# Patient Record
Sex: Female | Born: 2000 | Race: Black or African American | Hispanic: No | Marital: Single | Smoking: Never smoker
Health system: Southern US, Community
[De-identification: ages and names within clinical notes are randomized; demographics above are authoritative.]

## PROBLEM LIST (undated history)

## (undated) DIAGNOSIS — I10 Essential (primary) hypertension: Secondary | ICD-10-CM

## (undated) DIAGNOSIS — F32A Depression, unspecified: Secondary | ICD-10-CM

## (undated) DIAGNOSIS — F419 Anxiety disorder, unspecified: Secondary | ICD-10-CM

## (undated) DIAGNOSIS — F329 Major depressive disorder, single episode, unspecified: Secondary | ICD-10-CM

## (undated) DIAGNOSIS — F909 Attention-deficit hyperactivity disorder, unspecified type: Secondary | ICD-10-CM

---
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
## 2020-01-22 ENCOUNTER — Ambulatory Visit: Payer: Self-pay

## 2020-02-13 NOTE — Progress Notes (Deleted)
GYNECOLOGY ANNUAL PREVENTATIVE CARE ENCOUNTER NOTE  History:     Beverly Olson is a 20 y.o. No obstetric history on file. female here for a routine annual gynecologic exam.  Current complaints: ***.   Denies abnormal vaginal bleeding, discharge, pelvic pain, problems with intercourse or other gynecologic concerns.    Gynecologic History No LMP recorded. Contraception: *** Last Pap: None  Sexually acitve *** partners*** pain Onset of menses, flow***, pain  Obstetric History OB History  No obstetric history on file.    Past Medical History:  Diagnosis Date  . ADHD (attention deficit hyperactivity disorder)   . Anxiety   . Depression   . High blood pressure    she reports that she takes Clonidine for management    No past surgical history on file.  Current Outpatient Medications on File Prior to Visit  Medication Sig Dispense Refill  . cloNIDine (CATAPRES) 0.1 MG tablet Take 1.5 tablets (0.15 mg total) by mouth at bedtime. (Patient not taking: Reported on 08/02/2018) 45 tablet 0  . dexmethylphenidate (FOCALIN XR) 10 MG 24 hr capsule Take 1 capsule (10 mg total) by mouth daily. (Patient not taking: Reported on 08/02/2018) 30 capsule 0  . sertraline (ZOLOFT) 25 MG tablet Take 3 tablets (75 mg total) by mouth daily. (Patient not taking: Reported on 08/02/2018) 90 tablet 0   No current facility-administered medications on file prior to visit.    No Known Allergies  Social History:  reports that she has never smoked. She has never used smokeless tobacco. She reports that she does not drink alcohol and does not use drugs.  Family History  Problem Relation Age of Onset  . Hypertension Mother   . Bipolar disorder Mother     The following portions of the patient's history were reviewed and updated as appropriate: allergies, current medications, past family history, past medical history, past social history, past surgical history and problem list.  Review of  Systems Pertinent items noted in HPI and remainder of comprehensive ROS otherwise negative.  Physical Exam:  There were no vitals taken for this visit. CONSTITUTIONAL: Well-developed, well-nourished female in no acute distress.  HENT:  Normocephalic, atraumatic, External right and left ear normal. Oropharynx is clear and moist EYES: Conjunctivae and EOM are normal. Pupils are equal, round, and reactive to light. No scleral icterus.  NECK: Normal range of motion, supple, no masses.  Normal thyroid.  SKIN: Skin is warm and dry. No rash noted. Not diaphoretic. No erythema. No pallor. MUSCULOSKELETAL: Normal range of motion. No tenderness.  No cyanosis, clubbing, or edema.  2+ distal pulses. NEUROLOGIC: Alert and oriented to person, place, and time. Normal reflexes, muscle tone coordination.  PSYCHIATRIC: Normal mood and affect. Normal behavior. Normal judgment and thought content. CARDIOVASCULAR: Normal heart rate noted, regular rhythm RESPIRATORY: Clear to auscultation bilaterally. Effort and breath sounds normal, no problems with respiration noted. BREASTS: Symmetric in size. No masses, tenderness, skin changes, nipple drainage, or lymphadenopathy bilaterally. Performed in the presence of a chaperone. ABDOMEN: Soft, no distention noted.  No tenderness, rebound or guarding.  PELVIC: Normal appearing external genitalia and urethral meatus; normal appearing vaginal mucosa and cervix.  No abnormal discharge noted.  Pap smear obtained.  Normal uterine size, no other palpable masses, no uterine or adnexal tenderness.  Performed in the presence of a chaperone.   Assessment and Plan:    1. Women's annual routine gynecological examination ***  Will follow up results of pap smear and manage accordingly. Mammogram scheduled  Routine preventative health maintenance measures emphasized. Please refer to After Visit Summary for other counseling recommendations.      [id number] RN, FNP, [facility]

## 2020-02-14 ENCOUNTER — Ambulatory Visit: Payer: Self-pay | Admitting: Nurse Practitioner

## 2020-02-14 DIAGNOSIS — Z01419 Encounter for gynecological examination (general) (routine) without abnormal findings: Secondary | ICD-10-CM

## 2020-02-17 ENCOUNTER — Encounter: Payer: Self-pay | Admitting: Nurse Practitioner

## 2020-03-17 ENCOUNTER — Telehealth: Payer: Self-pay | Admitting: Nurse Practitioner

## 2020-03-17 NOTE — Telephone Encounter (Signed)
Patient returned call.  I apologized for this error.  I assured patient I would address issue.  I have placed IT request to have erroneous messages removed.

## 2020-03-17 NOTE — Addendum Note (Signed)
Addended by: Pennie Banter on: 03/17/2020 10:27 AM   Modules accepted: Level of Service, SmartSet

## 2020-03-17 NOTE — Telephone Encounter (Signed)
This encounter was created in error - please disregard.

## 2020-03-17 NOTE — Telephone Encounter (Signed)
Telephone call to patient for follow-up on her concerns with a provider.

## 2020-03-17 NOTE — Addendum Note (Signed)
Addended by: Pennie Banter on: 03/17/2020 10:24 AM   Modules accepted: Level of Service, SmartSet

## 2020-08-28 ENCOUNTER — Ambulatory Visit: Admit: 2020-08-28 | Discharge status: Absent without leave | Payer: Self-pay

## 2020-08-31 ENCOUNTER — Emergency Department: Payer: PRIVATE HEALTH INSURANCE

## 2020-08-31 ENCOUNTER — Other Ambulatory Visit: Payer: Self-pay

## 2020-08-31 ENCOUNTER — Emergency Department
Admission: EM | Admit: 2020-08-31 | Discharge: 2020-08-31 | Disposition: A | Discharge status: Home / Self Care | Payer: PRIVATE HEALTH INSURANCE | Attending: Student in an Organized Health Care Education/Training Program | Admitting: Student in an Organized Health Care Education/Training Program

## 2020-08-31 DIAGNOSIS — N12 Tubulo-interstitial nephritis, not specified as acute or chronic: Secondary | ICD-10-CM

## 2020-08-31 DIAGNOSIS — R1011 Right upper quadrant pain: Secondary | ICD-10-CM

## 2020-08-31 DIAGNOSIS — Z20822 Contact with and (suspected) exposure to covid-19: Secondary | ICD-10-CM | POA: Insufficient documentation

## 2020-08-31 LAB — PROTIME-INR
INR: 1.2 (ref 0.8–1.2)
Prothrombin Time: 15.4 seconds — ABNORMAL HIGH (ref 11.4–15.2)

## 2020-08-31 LAB — CBC
HCT: 38.1 % (ref 36.0–46.0)
Hemoglobin: 13.2 g/dL (ref 12.0–15.0)
MCH: 31.1 pg (ref 26.0–34.0)
MCHC: 34.6 g/dL (ref 30.0–36.0)
MCV: 89.9 fL (ref 80.0–100.0)
Platelets: 249 10*3/uL (ref 150–400)
RBC: 4.24 MIL/uL (ref 3.87–5.11)
RDW: 12.7 % (ref 11.5–15.5)
WBC: 19.9 10*3/uL — ABNORMAL HIGH (ref 4.0–10.5)
nRBC: 0 % (ref 0.0–0.2)

## 2020-08-31 LAB — URINALYSIS, COMPLETE (UACMP) WITH MICROSCOPIC
Bilirubin Urine: NEGATIVE
Glucose, UA: NEGATIVE mg/dL
Ketones, ur: 5 mg/dL — AB
Nitrite: NEGATIVE
Protein, ur: 100 mg/dL — AB
Specific Gravity, Urine: 1.039 — ABNORMAL HIGH (ref 1.005–1.030)
WBC, UA: >50 WBC/hpf — ABNORMAL HIGH (ref 0–5)
pH: 6 (ref 5.0–8.0)

## 2020-08-31 LAB — MAGNESIUM: Magnesium: 1.8 mg/dL (ref 1.7–2.4)

## 2020-08-31 LAB — RESP PANEL BY RT-PCR (FLU A&B, COVID) ARPGX2
Influenza A by PCR: NEGATIVE
Influenza B by PCR: NEGATIVE
SARS Coronavirus 2 by RT PCR: NEGATIVE

## 2020-08-31 LAB — COMPREHENSIVE METABOLIC PANEL
ALT: 127 U/L — ABNORMAL HIGH (ref 0–44)
AST: 87 U/L — ABNORMAL HIGH (ref 15–41)
Albumin: 3.8 g/dL (ref 3.5–5.0)
Alkaline Phosphatase: 29 U/L — ABNORMAL LOW (ref 38–126)
Anion gap: 12 (ref 5–15)
BUN: 8 mg/dL (ref 6–20)
CO2: 26 mmol/L (ref 22–32)
Calcium: 9 mg/dL (ref 8.9–10.3)
Chloride: 96 mmol/L — ABNORMAL LOW (ref 98–111)
Creatinine, Ser: 0.97 mg/dL (ref 0.44–1.00)
GFR, Estimated: >60 mL/min (ref >60)
Glucose, Bld: 123 mg/dL — ABNORMAL HIGH (ref 70–99)
Potassium: 2.7 mmol/L — CL (ref 3.5–5.1)
Sodium: 134 mmol/L — ABNORMAL LOW (ref 135–145)
Total Bilirubin: 0.9 mg/dL (ref 0.3–1.2)
Total Protein: 8.2 g/dL — ABNORMAL HIGH (ref 6.5–8.1)

## 2020-08-31 LAB — APTT: aPTT: 40 seconds — ABNORMAL HIGH (ref 24–36)

## 2020-08-31 LAB — HCG, QUANTITATIVE, PREGNANCY: hCG, Beta Chain, Quant, S: 1 m[IU]/mL (ref <5)

## 2020-08-31 LAB — LACTIC ACID, PLASMA: Lactic Acid, Venous: 1.1 mmol/L (ref 0.5–1.9)

## 2020-08-31 LAB — LIPASE, BLOOD: Lipase: 25 U/L (ref 11–51)

## 2020-08-31 MED ORDER — ONDANSETRON HCL 4 MG/2ML IJ SOLN
4.0000 mg | Freq: Once | INTRAMUSCULAR | Status: AC
  Administered 2020-08-31: 4 mg via INTRAVENOUS
  Filled 2020-08-31: qty 2

## 2020-08-31 MED ORDER — LACTATED RINGERS IV BOLUS
1000.0000 mL | Freq: Once | INTRAVENOUS | Status: AC
  Administered 2020-08-31: 1000 mL via INTRAVENOUS

## 2020-08-31 MED ORDER — SULFAMETHOXAZOLE-TRIMETHOPRIM 800-160 MG PO TABS: 1.0000 | ORAL_TABLET | Freq: Two times a day (BID) | ORAL | 0 refills | Status: AC

## 2020-08-31 MED ORDER — KETOROLAC TROMETHAMINE 30 MG/ML IJ SOLN
15.0000 mg | Freq: Once | INTRAMUSCULAR | Status: AC
  Administered 2020-08-31: 15 mg via INTRAVENOUS
  Filled 2020-08-31: qty 1

## 2020-08-31 MED ORDER — POTASSIUM CHLORIDE 10 MEQ/100ML IV SOLN
10.0000 meq | Freq: Once | INTRAVENOUS | Status: AC
  Administered 2020-08-31: 10 meq via INTRAVENOUS
  Filled 2020-08-31: qty 100

## 2020-08-31 MED ORDER — IBUPROFEN 600 MG PO TABS
600.0000 mg | ORAL_TABLET | Freq: Once | ORAL | Status: DC
  Filled 2020-08-31: qty 1

## 2020-08-31 MED ORDER — MORPHINE SULFATE (PF) 4 MG/ML IV SOLN
4.0000 mg | INTRAVENOUS | Status: DC | PRN
  Administered 2020-08-31: 4 mg via INTRAVENOUS
  Filled 2020-08-31: qty 1

## 2020-08-31 MED ORDER — IOHEXOL 350 MG/ML SOLN
80.0000 mL | Freq: Once | INTRAVENOUS | Status: AC | PRN
  Administered 2020-08-31: 80 mL via INTRAVENOUS

## 2020-08-31 MED ORDER — SODIUM CHLORIDE 0.9 % IV BOLUS
1000.0000 mL | Freq: Once | INTRAVENOUS | Status: AC
  Administered 2020-08-31: 1000 mL via INTRAVENOUS

## 2020-08-31 MED ORDER — ACETAMINOPHEN 325 MG PO TABS
650.0000 mg | ORAL_TABLET | Freq: Once | ORAL | Status: AC
  Administered 2020-08-31: 650 mg via ORAL
  Filled 2020-08-31: qty 2

## 2020-08-31 MED ORDER — SODIUM CHLORIDE 0.9 % IV SOLN
1.0000 g | Freq: Once | INTRAVENOUS | Status: AC
  Administered 2020-08-31: 1 g via INTRAVENOUS
  Filled 2020-08-31: qty 10

## 2020-08-31 MED ORDER — ONDANSETRON HCL 4 MG PO TABS: 4.0000 mg | ORAL_TABLET | Freq: Three times a day (TID) | ORAL | 0 refills | Status: AC | PRN

## 2020-08-31 NOTE — ED Triage Notes (Signed)
Pt comes via [facility] with RUQ pain bilateral. Pt states this started few days ago. Pt states N/V  HR-94 112/79 RR-16 100%RA CBG-133

## 2020-08-31 NOTE — ED Provider Notes (Signed)
[facility] Emergency Department Provider Note    Event Date/Time   First MD Initiated Contact with Patient 08/31/20 1311     (approximate)  I have reviewed the triage vital signs and the nursing notes.   HISTORY  Chief Complaint Abdominal Pain    HPI Beverly Olson is a 20 y.o. female below listed past medical history presents to the ER for evaluation of generalized abdominal pain this been ongoing for the past several days associated nausea vomiting poor p.o. intake.  Describes it as crampy in nature radiating through to her back.  States primarily in the right upper quadrant but hurts all over.  Is never had pain like this before.  Has been taking Motrin with only mild improvement.  Denies any vaginal bleeding or discharge.  No dysuria.  Past Medical History:  Diagnosis Date   ADHD (attention deficit hyperactivity disorder)    Anxiety    Depression    High blood pressure    she reports that she takes Clonidine for management   Family History  Problem Relation Age of Onset   Hypertension Mother    Bipolar disorder Mother    History reviewed. No pertinent surgical history. Patient Active Problem List   Diagnosis Date Noted   ADHD (attention deficit hyperactivity disorder), combined type 03/18/2013   ODD (oppositional defiant disorder) 03/18/2013   MDD (major depressive disorder), recurrent episode, severe (HCC) 03/17/2013      Prior to Admission medications   Medication Sig Start Date End Date Taking? Authorizing Provider  cloNIDine (CATAPRES) 0.1 MG tablet Take 1.5 tablets (0.15 mg total) by mouth at bedtime. Patient not taking: Reported on 08/02/2018 03/25/13   Jolene Schimke, NP  dexmethylphenidate (FOCALIN XR) 10 MG 24 hr capsule Take 1 capsule (10 mg total) by mouth daily. Patient not taking: Reported on 08/02/2018 03/25/13   Trinda Pascal B, NP  sertraline (ZOLOFT) 25 MG tablet Take 3 tablets (75 mg total) by mouth daily. Patient not taking:  Reported on 08/02/2018 03/25/13   Jolene Schimke, NP    Allergies Patient has no known allergies.    Social History Social History   Tobacco Use   Smoking status: Never   Smokeless tobacco: Never  Substance Use Topics   Alcohol use: No   Drug use: No    Review of Systems Patient denies headaches, rhinorrhea, blurry vision, numbness, shortness of breath, chest pain, edema, cough, abdominal pain, nausea, vomiting, diarrhea, dysuria, fevers, rashes or hallucinations unless otherwise stated above in HPI. ____________________________________________   PHYSICAL EXAM:  VITAL SIGNS: Vitals:   08/31/20 1205 08/31/20 1500  BP: 98/85 121/72  Pulse: (!) 104 91  Resp: 18   Temp: (!) 102.1 F (38.9 C)   SpO2: 100% 100%    Constitutional: Alert and oriented.  Eyes: Conjunctivae are normal.  Head: Atraumatic. Nose: No congestion/rhinnorhea. Mouth/Throat: Mucous membranes are moist.   Neck: No stridor. Painless ROM.  Cardiovascular: Normal rate, regular rhythm. Grossly normal heart sounds.  Good peripheral circulation. Respiratory: Normal respiratory effort.  No retractions. Lungs CTAB. Gastrointestinal: Soft with mild ruq ttp. No distention. No abdominal bruits. No CVA tenderness. Genitourinary:  Musculoskeletal: No lower extremity tenderness nor edema.  No joint effusions. Neurologic:  Normal speech and language. No gross focal neurologic deficits are appreciated. No facial droop Skin:  Skin is warm, dry and intact. No rash noted. Psychiatric: Mood and affect are normal. Speech and behavior are normal.  ____________________________________________   LABS (all labs ordered  are listed, but only abnormal results are displayed)  Results for orders placed or performed during the hospital encounter of 08/31/20 (from the past 24 hour(s))  Lipase, blood     Status: None   Collection Time: 08/31/20 11:48 AM  Result Value Ref Range   Lipase 25 11 - 51 U/L  Comprehensive metabolic  panel     Status: Abnormal   Collection Time: 08/31/20 11:48 AM  Result Value Ref Range   Sodium 134 (L) 135 - 145 mmol/L   Potassium 2.7 (LL) 3.5 - 5.1 mmol/L   Chloride 96 (L) 98 - 111 mmol/L   CO2 26 22 - 32 mmol/L   Glucose, Bld 123 (H) 70 - 99 mg/dL   BUN 8 6 - 20 mg/dL   Creatinine, Ser 2.50 0.44 - 1.00 mg/dL   Calcium 9.0 8.9 - 03.7 mg/dL   Total Protein 8.2 (H) 6.5 - 8.1 g/dL   Albumin 3.8 3.5 - 5.0 g/dL   AST 87 (H) 15 - 41 U/L   ALT 127 (H) 0 - 44 U/L   Alkaline Phosphatase 29 (L) 38 - 126 U/L   Total Bilirubin 0.9 0.3 - 1.2 mg/dL   GFR, Estimated >04 >88 mL/min   Anion gap 12 5 - 15  CBC     Status: Abnormal   Collection Time: 08/31/20 11:48 AM  Result Value Ref Range   WBC 19.9 (H) 4.0 - 10.5 K/uL   RBC 4.24 3.87 - 5.11 MIL/uL   Hemoglobin 13.2 12.0 - 15.0 g/dL   HCT 89.1 69.4 - 50.3 %   MCV 89.9 80.0 - 100.0 fL   MCH 31.1 26.0 - 34.0 pg   MCHC 34.6 30.0 - 36.0 g/dL   RDW 88.8 28.0 - 03.4 %   Platelets 249 150 - 400 K/uL   nRBC 0.0 0.0 - 0.2 %  hCG, quantitative, pregnancy     Status: None   Collection Time: 08/31/20 11:48 AM  Result Value Ref Range   hCG, Beta Chain, Quant, S 1 <5 mIU/mL   ____________________________________________ ____________________________________________  RADIOLOGY  I personally reviewed all radiographic images ordered to evaluate for the above acute complaints and reviewed radiology reports and findings.  These findings were personally discussed with the patient.  Please see medical record for radiology report.  ____________________________________________   PROCEDURES  Procedure(s) performed:  Procedures    Critical Care performed: no ____________________________________________   INITIAL IMPRESSION / ASSESSMENT AND PLAN / ED COURSE  Pertinent labs & imaging results that were available during my care of the patient were reviewed by me and considered in my medical decision making (see chart for details).   DDX:  cholecystitis, cholelithiasis, enteritis, appendicitis, pyelo, stone, pid  Beverly Olson is a 20 y.o. who presents to the ED with abdominal pain described above.  She is febrile but nontoxic-appearing does have right-sided abdominal pain.  Will order right upper quadrant ultrasound.  Right upper quadrant ultrasound is equivocal does have new leukocytosis.  Not able to provide urine will give additional IV fluids.  Will order CT imaging.  Work-up signed out to oncoming physician.  Lactate still pending as well as COVID.     The patient was evaluated in Emergency Department today for the symptoms described in the history of present illness. He/she was evaluated in the context of the global COVID-19 pandemic, which necessitated consideration that the patient might be at risk for infection with the SARS-CoV-2 virus that causes COVID-19. Institutional protocols and algorithms that pertain  to the evaluation of patients at risk for COVID-19 are in a state of rapid change based on information released by regulatory bodies including the CDC and federal and state organizations. These policies and algorithms were followed during the patient's care in the ED.  As part of my medical decision making, I reviewed the following data within the electronic MEDICAL RECORD NUMBER Nursing notes reviewed and incorporated, Labs reviewed, notes from prior ED visits and [place] Controlled Substance Database  ____________________________________________   FINAL CLINICAL IMPRESSION(S) / ED DIAGNOSES  Final diagnoses:  RUQ abdominal pain      NEW MEDICATIONS STARTED DURING THIS VISIT:  New Prescriptions   No medications on file     Note:  This document was prepared using Dragon voice recognition software and may include unintentional dictation errors.    Willy Eddy, MD 08/31/20 1515

## 2020-08-31 NOTE — ED Provider Notes (Signed)
I assumed care of this patient approximately 1500.  Please see all providers note for full details gunpoint initial evaluation assessment.  In brief she presents for 2 3 days of some right upper quadrant abdominal pain associate with nonbloody nonbilious vomiting.  On arrival she is febrile and slight tachycardic.  Initial work-up was remarkable for leukocytosis.  CMP shows very mild transaminitis but otherwise largely unremarkable aside from hypokalemia.  Right upper quadrant ultrasound is negative.  Lactic sent as well as blood cultures with signs of possible sepsis. Plan is to follow-up CT abdomen pelvis.  As needed pelvis markable for evidence of bilateral pyelonephritis without evidence of kidney stone, appendicitis, cholecystitis, pancreatitis, diverticulitis or other acute process aside from possible mild enterocolitis.  UA is concerning for infection with large leukocyte esterase, greater than 50 WBCs, bacteria present.  Patient given dose of Rocephin in the emergency room as well as IV fluids and analgesia.  On reassessment she is feeling little better.  She did not do any SIRS criteria doubtful if she has bacteremic or otherwise septic at this time and I think she is stable for discharge with close outpatient follow-up.  Nausea is better controlled and she is tolerating p.o.  Will discharge with Bactrim and Zofran.  Advised to follow-up with PCP in 2 to 3 days have her potassium rechecked.  Discharged stable condition.  Strict return precautions advised and discussed.   Gilles Chiquito, MD 08/31/20 [phone]

## 2020-08-31 NOTE — ED Notes (Signed)
Pt lying in bed watching TV, awaiting results from tests. Pt states that she feels better than when she first came, but is still having some pain. Pt vitals stable and in no obvious distress at this time.

## 2020-09-01 ENCOUNTER — Telehealth: Payer: Self-pay | Admitting: Emergency Medicine

## 2020-09-01 LAB — BLOOD CULTURE ID PANEL (REFLEXED) - BCID2

## 2020-09-01 LAB — POC URINE PREG, ED: Preg Test, Ur: NEGATIVE

## 2020-09-03 LAB — CULTURE, BLOOD (SINGLE): Special Requests: ADEQUATE

## 2020-09-03 LAB — URINE CULTURE: Culture: >=100000 — AB

## 2021-05-20 ENCOUNTER — Encounter: Payer: PRIVATE HEALTH INSURANCE | Admitting: Obstetrics & Gynecology

## 2021-06-04 ENCOUNTER — Other Ambulatory Visit: Payer: Self-pay

## 2021-06-04 MED ORDER — SERTRALINE HCL 100 MG PO TABS
100.0000 mg | ORAL_TABLET | Freq: Every day | ORAL | 1 refills | Status: AC
  Filled 2021-06-04 – 2021-10-01 (×3): qty 90, 90d supply, fill #0

## 2021-06-22 ENCOUNTER — Other Ambulatory Visit: Payer: Self-pay

## 2021-09-07 ENCOUNTER — Other Ambulatory Visit: Payer: Self-pay

## 2021-09-21 ENCOUNTER — Other Ambulatory Visit: Payer: Self-pay

## 2021-10-01 ENCOUNTER — Other Ambulatory Visit: Payer: Self-pay

## 2022-01-25 ENCOUNTER — Other Ambulatory Visit: Payer: Self-pay

## 2022-01-26 ENCOUNTER — Other Ambulatory Visit: Payer: Self-pay

## 2022-01-26 MED ORDER — SERTRALINE HCL 100 MG PO TABS
100.0000 mg | ORAL_TABLET | Freq: Every day | ORAL | 0 refills | Status: AC
  Filled 2022-02-11: qty 30, 30d supply, fill #0

## 2022-02-11 ENCOUNTER — Other Ambulatory Visit: Payer: Self-pay

## 2022-09-09 IMAGING — US US ABDOMEN LIMITED
1 series · 14 of 25 positions shown · non-contrast
Comparison: None.

CLINICAL DATA: Right upper quadrant pain

EXAM:
ULTRASOUND ABDOMEN LIMITED RIGHT UPPER QUADRANT

[Series 1: us abdomen limited ruq (liver/gb) · 14 of 48 slices shown]
[im 1/48]
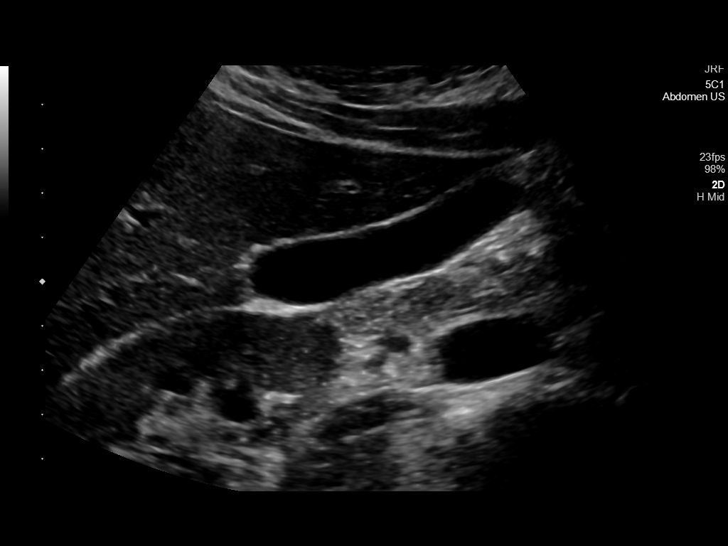
[im 4/48]
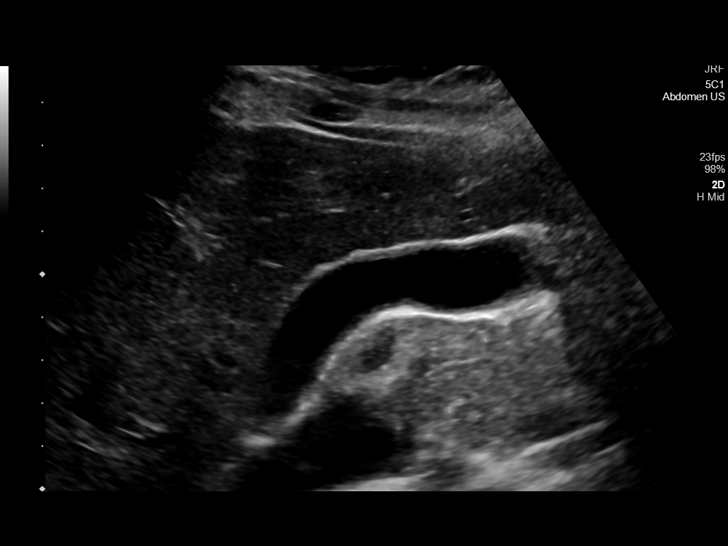
[im 8/48]
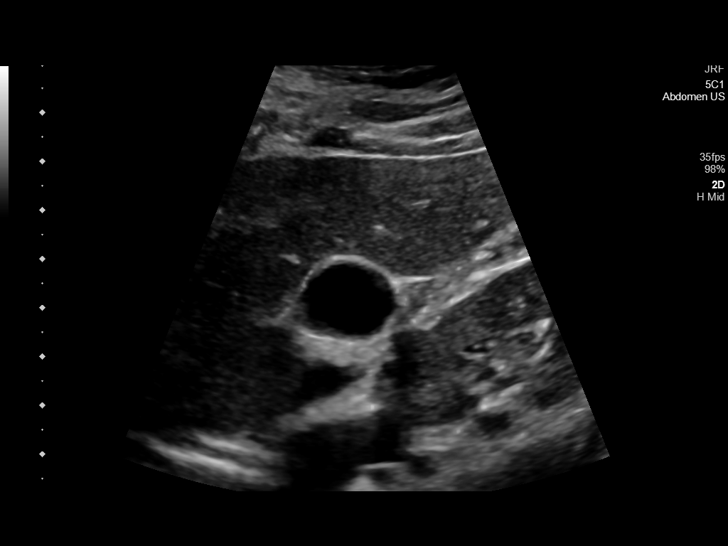
[im 12/48]
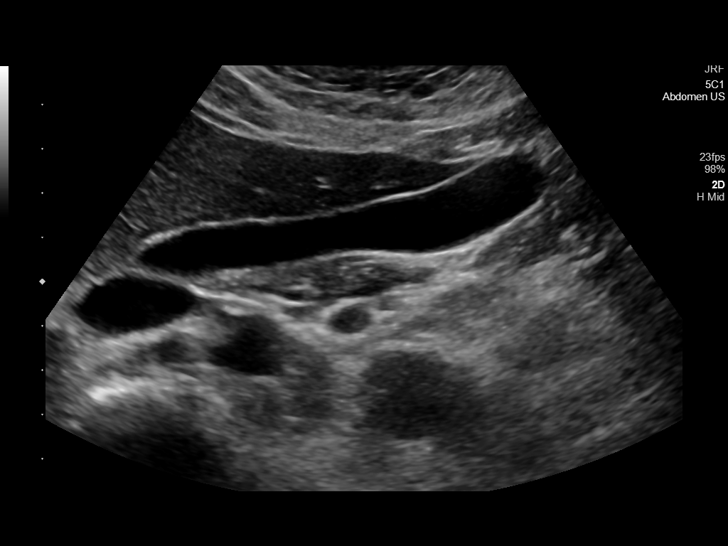
[im 16/48]
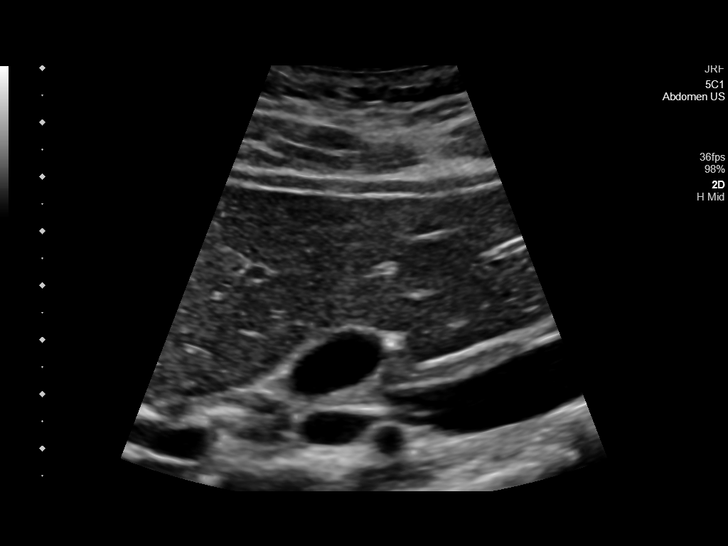
[im 18/48]
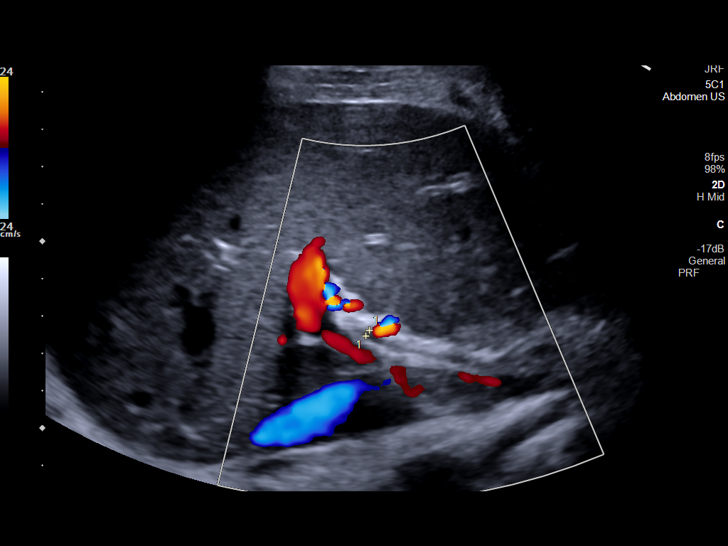
[im 22/48]
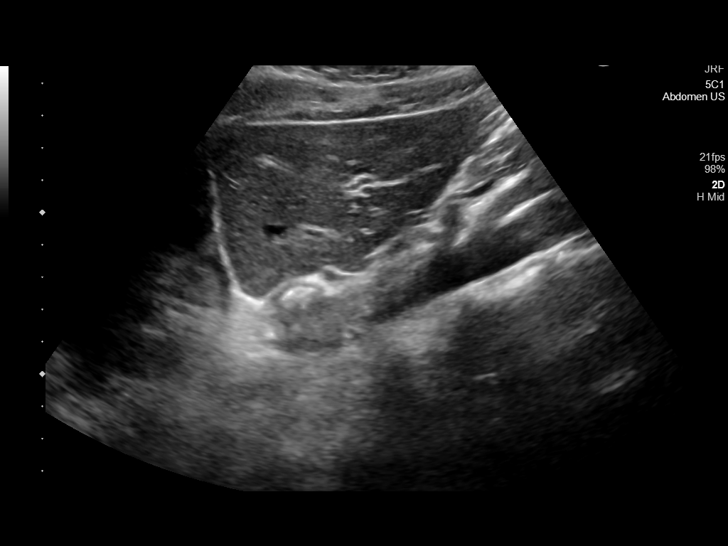
[im 26/48]
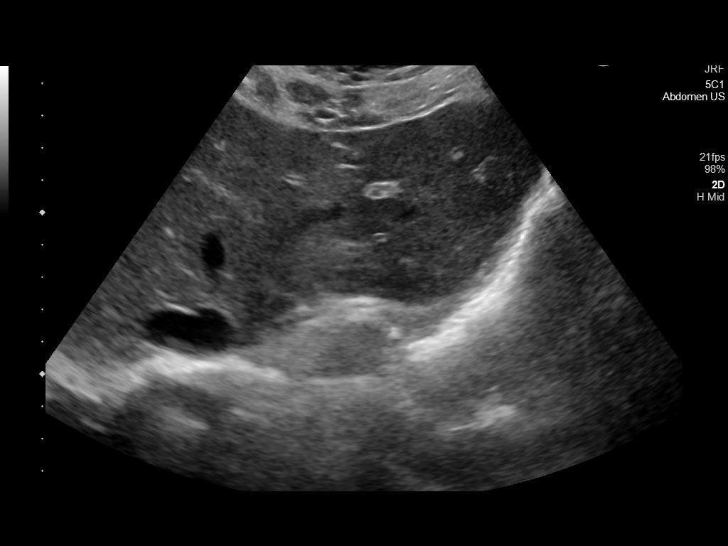
[im 30/48]
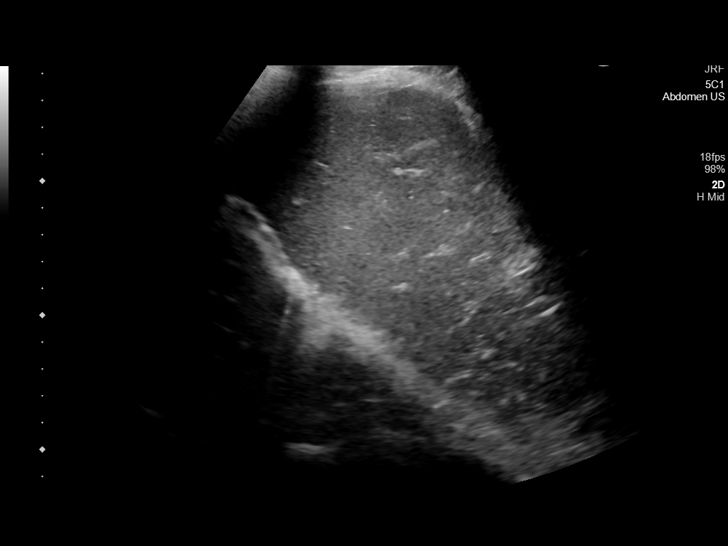
[im 32/48]
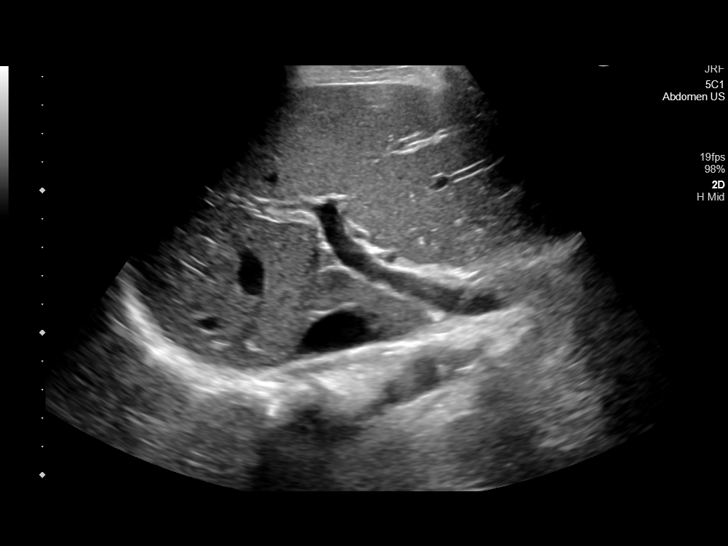
[im 36/48]
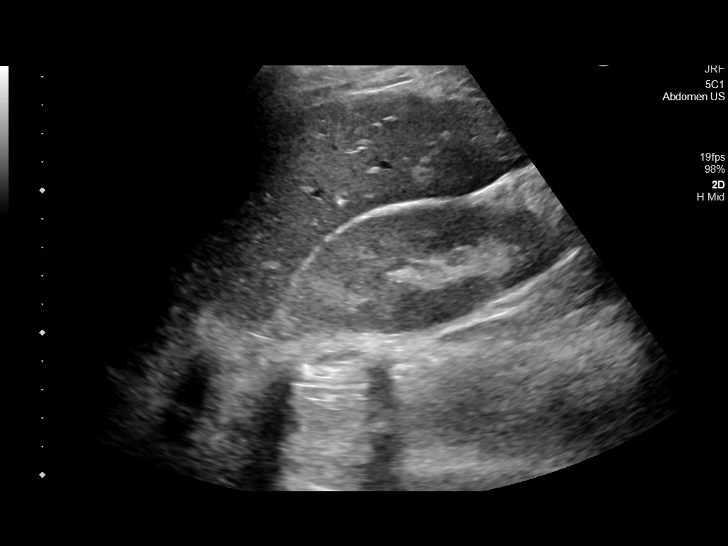
[im 40/48]
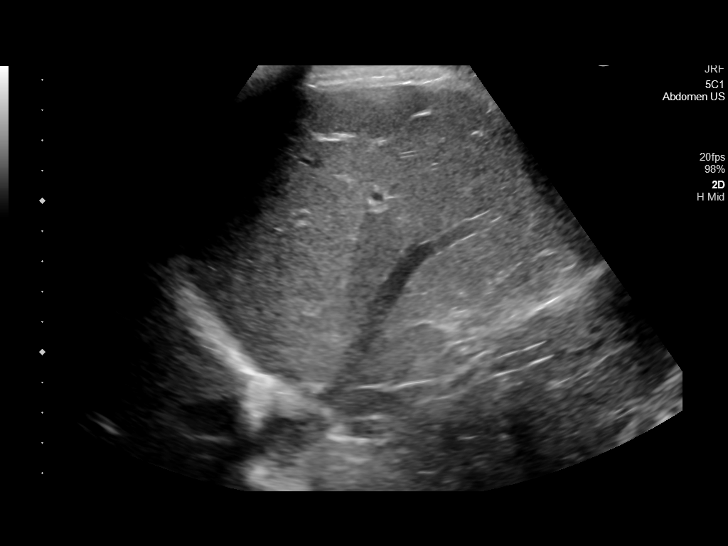
[im 44/48]
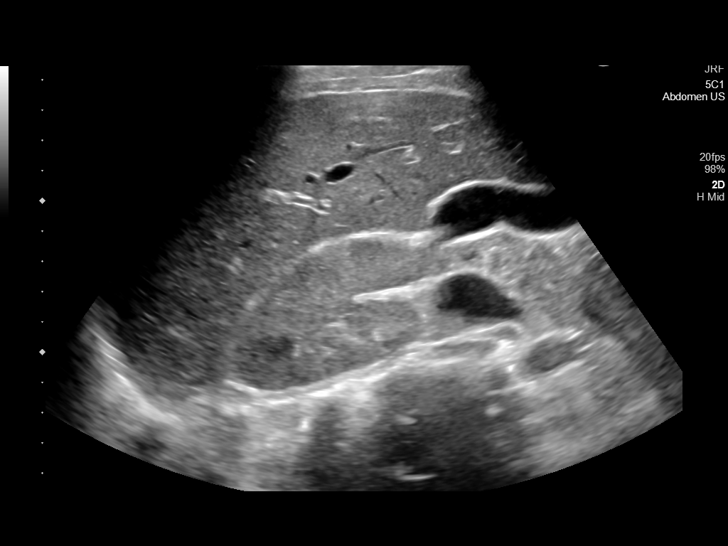
[im 48/48]
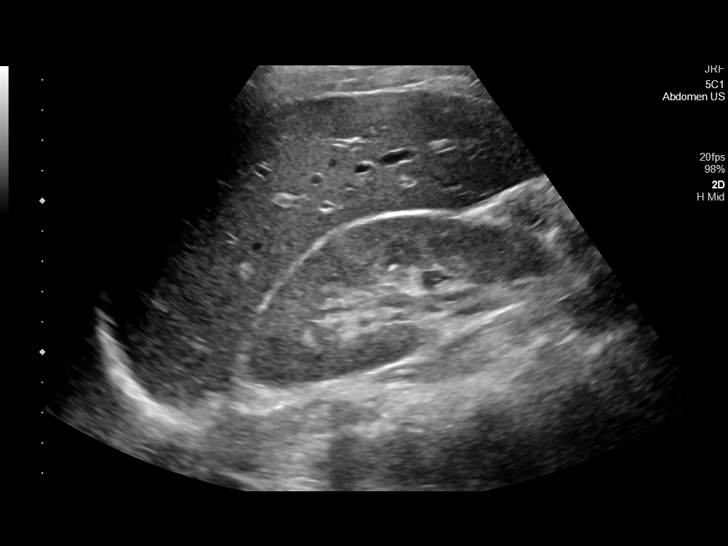

[14 of 25 positions shown; findings below may reference images not displayed]

FINDINGS: Gallbladder:

No gallstones or wall thickening visualized. No sonographic Murphy
sign noted by sonographer.

Common bile duct:

Diameter: 2 mm, normal

Liver:

No focal lesion identified. Within normal limits in parenchymal
echogenicity. Portal vein is patent on color Doppler imaging with
normal direction of blood flow towards the liver.

Other: None.
IMPRESSION: Normal right upper quadrant ultrasound.
